# Patient Record
Sex: Female | Born: 1950 | Race: White | Hispanic: No | Marital: Married
Health system: Southern US, Community
[De-identification: ages and names within clinical notes are randomized; demographics above are authoritative.]

---

## 2004-11-14 ENCOUNTER — Encounter: Admission: RE | Admit: 2004-11-14 | Discharge: 2004-11-14 | Payer: Self-pay | Admitting: Neurosurgery

## 2005-01-08 ENCOUNTER — Ambulatory Visit: Payer: Self-pay | Admitting: Internal Medicine

## 2005-06-06 ENCOUNTER — Other Ambulatory Visit: Payer: Self-pay

## 2005-06-06 ENCOUNTER — Inpatient Hospital Stay: Payer: Self-pay | Admitting: Internal Medicine

## 2005-07-05 ENCOUNTER — Ambulatory Visit: Payer: Self-pay | Admitting: Internal Medicine

## 2005-07-15 ENCOUNTER — Emergency Department: Payer: Self-pay | Admitting: Emergency Medicine

## 2006-09-25 ENCOUNTER — Emergency Department: Payer: Self-pay | Admitting: Emergency Medicine

## 2007-02-03 ENCOUNTER — Emergency Department: Payer: Self-pay | Admitting: Emergency Medicine

## 2007-03-01 ENCOUNTER — Emergency Department: Payer: Self-pay | Admitting: Emergency Medicine

## 2008-11-15 ENCOUNTER — Emergency Department: Payer: Self-pay | Admitting: Emergency Medicine

## 2009-09-30 ENCOUNTER — Ambulatory Visit: Payer: Self-pay | Admitting: Internal Medicine

## 2010-03-11 ENCOUNTER — Emergency Department: Payer: Self-pay | Admitting: Emergency Medicine

## 2010-06-27 ENCOUNTER — Ambulatory Visit: Payer: Self-pay | Admitting: Family

## 2010-12-05 ENCOUNTER — Ambulatory Visit: Payer: Self-pay | Admitting: Internal Medicine

## 2010-12-29 ENCOUNTER — Ambulatory Visit: Payer: Self-pay | Admitting: Ophthalmology

## 2011-01-02 ENCOUNTER — Ambulatory Visit: Payer: Self-pay | Admitting: Ophthalmology

## 2012-10-24 ENCOUNTER — Inpatient Hospital Stay: Payer: Self-pay | Admitting: Nurse Practitioner

## 2012-10-24 LAB — CBC
HCT: 38.3 % (ref 35.0–47.0)
HGB: 13.3 g/dL (ref 12.0–16.0)
MCH: 31.6 pg (ref 26.0–34.0)
MCHC: 34.8 g/dL (ref 32.0–36.0)
MCV: 91 fL (ref 80–100)
Platelet: 191 10*3/uL (ref 150–440)
RBC: 4.22 10*6/uL (ref 3.80–5.20)
RDW: 14.3 % (ref 11.5–14.5)
WBC: 5.8 10*3/uL (ref 3.6–11.0)

## 2012-10-24 LAB — URINALYSIS, COMPLETE
Bacteria: NONE SEEN
Bilirubin,UR: NEGATIVE
Blood: NEGATIVE
Glucose,UR: NEGATIVE mg/dL (ref 0–75)
Ketone: NEGATIVE
Leukocyte Esterase: NEGATIVE
Nitrite: NEGATIVE
Ph: 6 (ref 4.5–8.0)
Protein: NEGATIVE
RBC,UR: 1 /HPF (ref 0–5)
Specific Gravity: 1.006 (ref 1.003–1.030)
Squamous Epithelial: NONE SEEN
WBC UR: 1 /HPF (ref 0–5)

## 2012-10-24 LAB — CK-MB: CK-MB: 3.9 ng/mL — ABNORMAL HIGH (ref 0.5–3.6)

## 2012-10-24 LAB — COMPREHENSIVE METABOLIC PANEL
Albumin: 3.6 g/dL (ref 3.4–5.0)
Alkaline Phosphatase: 76 U/L (ref 50–136)
Anion Gap: 7 (ref 7–16)
BUN: 13 mg/dL (ref 7–18)
Bilirubin,Total: 0.9 mg/dL (ref 0.2–1.0)
Calcium, Total: 9 mg/dL (ref 8.5–10.1)
Chloride: 108 mmol/L — ABNORMAL HIGH (ref 98–107)
Co2: 26 mmol/L (ref 21–32)
Creatinine: 0.55 mg/dL — ABNORMAL LOW (ref 0.60–1.30)
EGFR (African American): >60
EGFR (Non-African Amer.): >60
Glucose: 109 mg/dL — ABNORMAL HIGH (ref 65–99)
Osmolality: 282 (ref 275–301)
Potassium: 3.8 mmol/L (ref 3.5–5.1)
SGOT(AST): 17 U/L (ref 15–37)
SGPT (ALT): 17 U/L (ref 12–78)
Sodium: 141 mmol/L (ref 136–145)
Total Protein: 5.9 g/dL — ABNORMAL LOW (ref 6.4–8.2)

## 2012-10-24 LAB — PROTIME-INR
INR: 1
Prothrombin Time: 13.4 secs (ref 11.5–14.7)

## 2012-10-24 LAB — TROPONIN I: Troponin-I: <0.02 ng/mL

## 2012-10-26 LAB — BASIC METABOLIC PANEL
Anion Gap: 4 — ABNORMAL LOW (ref 7–16)
BUN: 4 mg/dL — ABNORMAL LOW (ref 7–18)
Calcium, Total: 8.6 mg/dL (ref 8.5–10.1)
Chloride: 106 mmol/L (ref 98–107)
Co2: 31 mmol/L (ref 21–32)
Creatinine: 0.47 mg/dL — ABNORMAL LOW (ref 0.60–1.30)
EGFR (African American): >60
EGFR (Non-African Amer.): >60
Glucose: 105 mg/dL — ABNORMAL HIGH (ref 65–99)
Osmolality: 279 (ref 275–301)
Potassium: 3.5 mmol/L (ref 3.5–5.1)
Sodium: 141 mmol/L (ref 136–145)

## 2012-10-26 LAB — HEMOGLOBIN: HGB: 9.8 g/dL — ABNORMAL LOW (ref 12.0–16.0)

## 2012-10-26 LAB — PLATELET COUNT: Platelet: 109 10*3/uL — ABNORMAL LOW (ref 150–440)

## 2012-10-27 LAB — HEMOGLOBIN: HGB: 9.4 g/dL — ABNORMAL LOW (ref 12.0–16.0)

## 2012-10-28 LAB — BASIC METABOLIC PANEL
Anion Gap: 4 — ABNORMAL LOW (ref 7–16)
BUN: 6 mg/dL — ABNORMAL LOW (ref 7–18)
Calcium, Total: 8.8 mg/dL (ref 8.5–10.1)
Chloride: 104 mmol/L (ref 98–107)
Co2: 30 mmol/L (ref 21–32)
Creatinine: 0.52 mg/dL — ABNORMAL LOW (ref 0.60–1.30)
EGFR (African American): >60
EGFR (Non-African Amer.): >60
Glucose: 126 mg/dL — ABNORMAL HIGH (ref 65–99)
Osmolality: 275 (ref 275–301)
Potassium: 3.6 mmol/L (ref 3.5–5.1)
Sodium: 138 mmol/L (ref 136–145)

## 2012-10-28 LAB — DIGOXIN LEVEL: Digoxin: 0.54 ng/mL

## 2012-10-28 LAB — TSH: Thyroid Stimulating Horm: 0.442 u[IU]/mL — ABNORMAL LOW

## 2013-01-01 ENCOUNTER — Ambulatory Visit: Payer: Self-pay | Admitting: Internal Medicine

## 2014-08-02 IMAGING — US US EXTREM LOW VENOUS*R*
1 series · 14 of 19 positions shown · non-contrast
Comparison: none

REASON FOR EXAM: CALL REPORT PARKER GLAZER [phone] AFTER 5 PM BEFORE 5PM
[phone] Right ...
COMMENTS:

[Series 1: us extrem low venous*right* · 0.09mm/px · 14 of 19 slices shown]
[im 1/19]
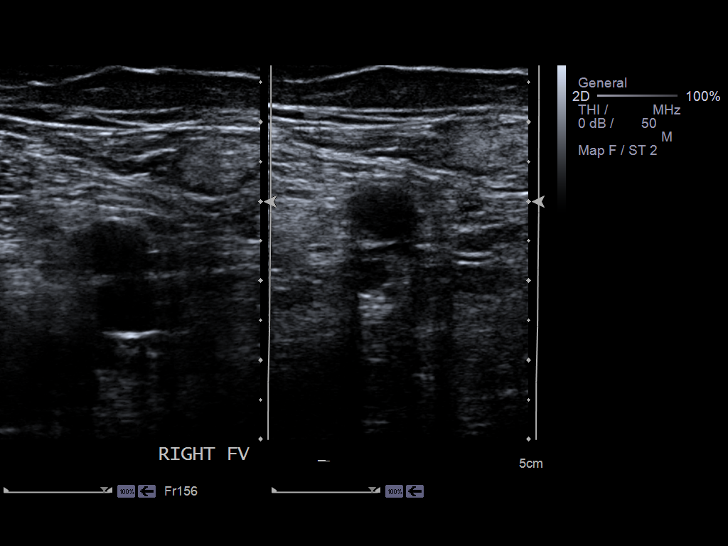
[im 3/19]
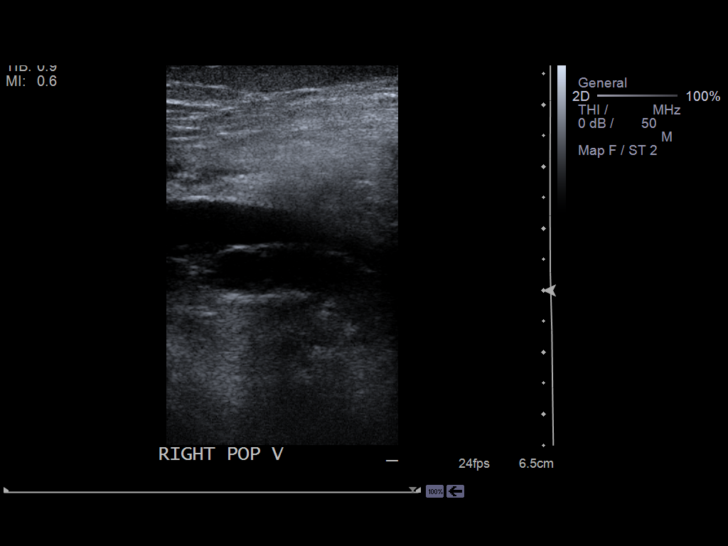
[im 4/19]
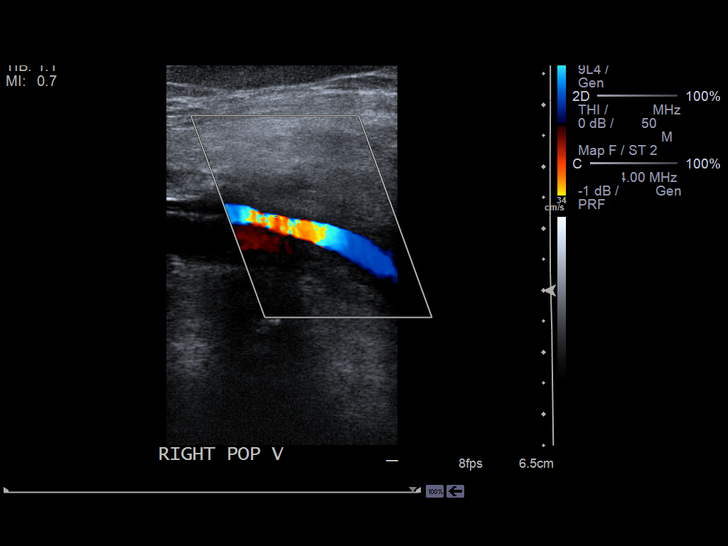
[im 5/19]
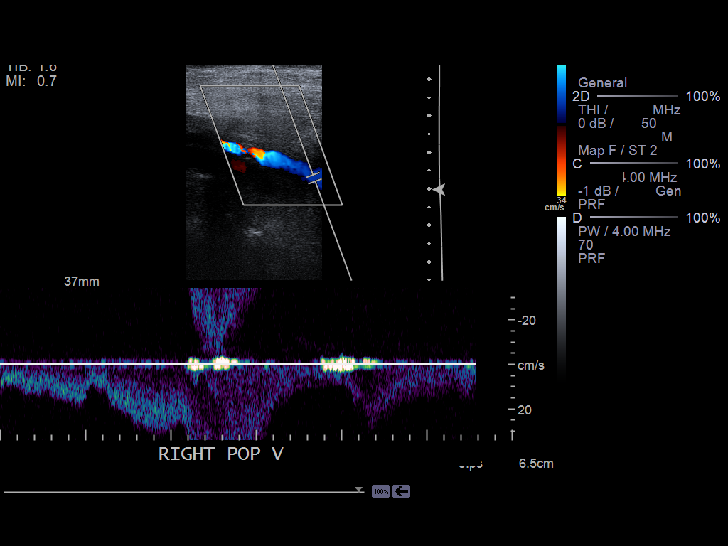
[im 7/19]
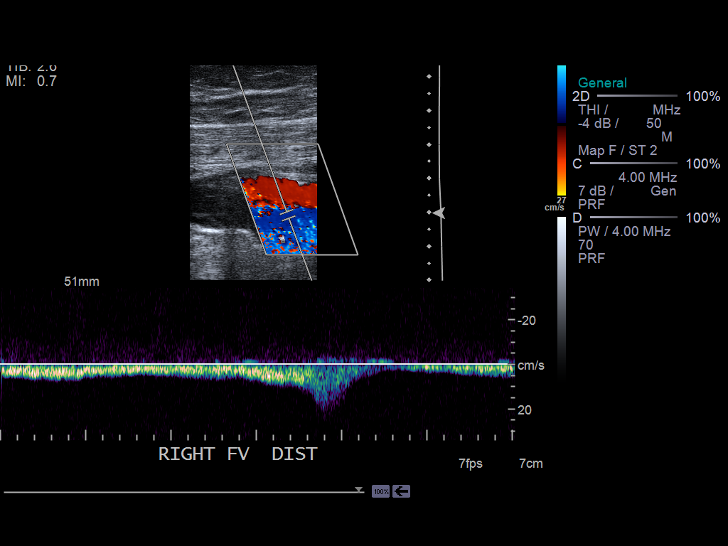
[im 8/19]
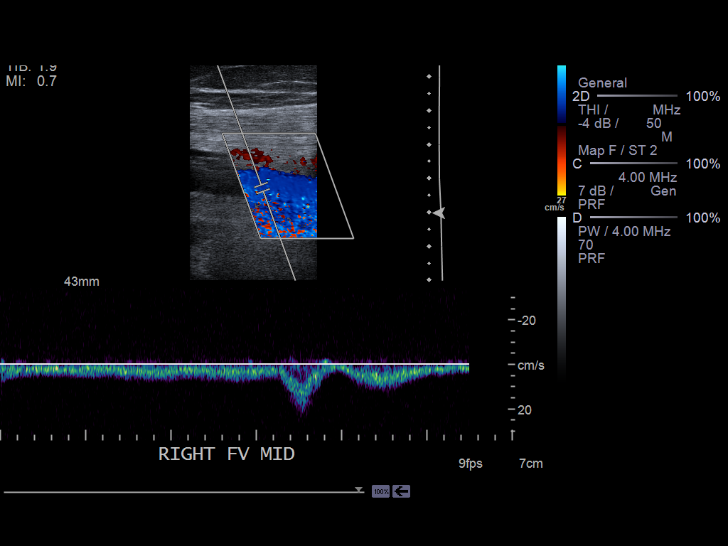
[im 9/19]
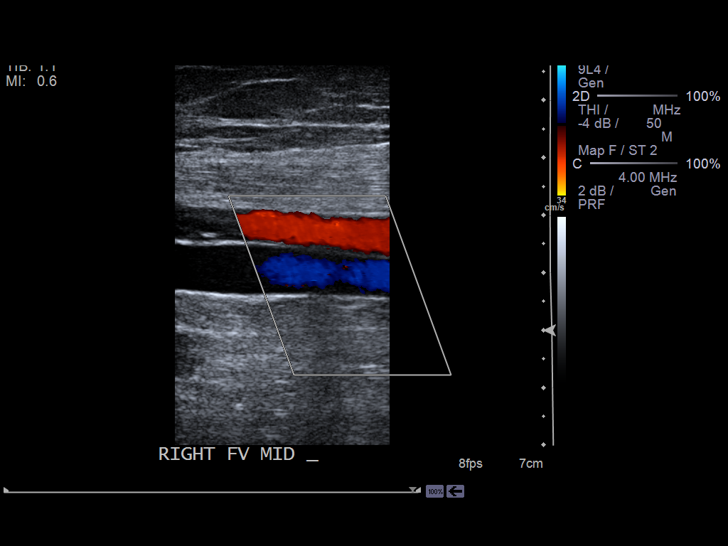
[im 11/19]
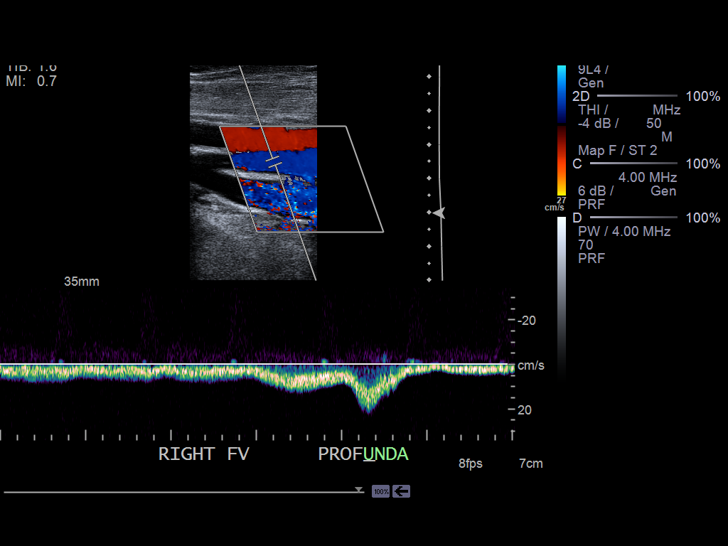
[im 12/19]
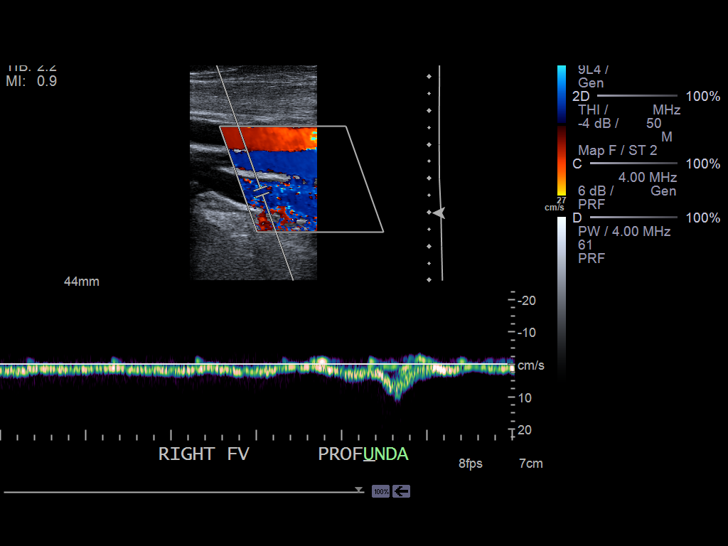
[im 13/19]
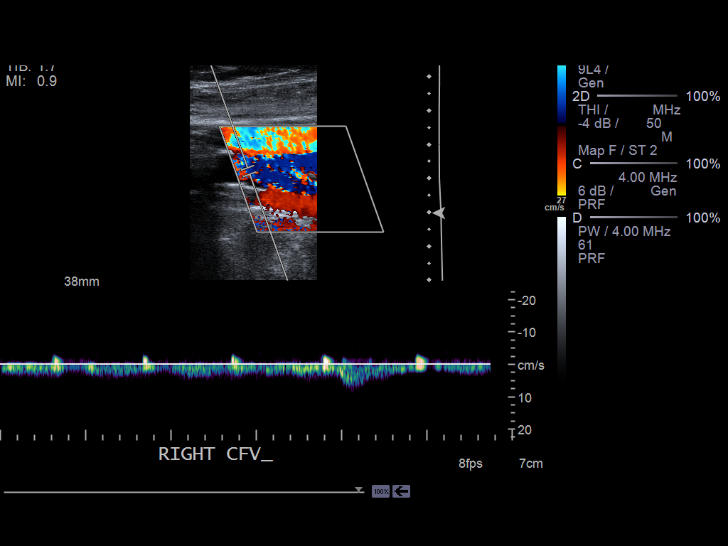
[im 15/19]
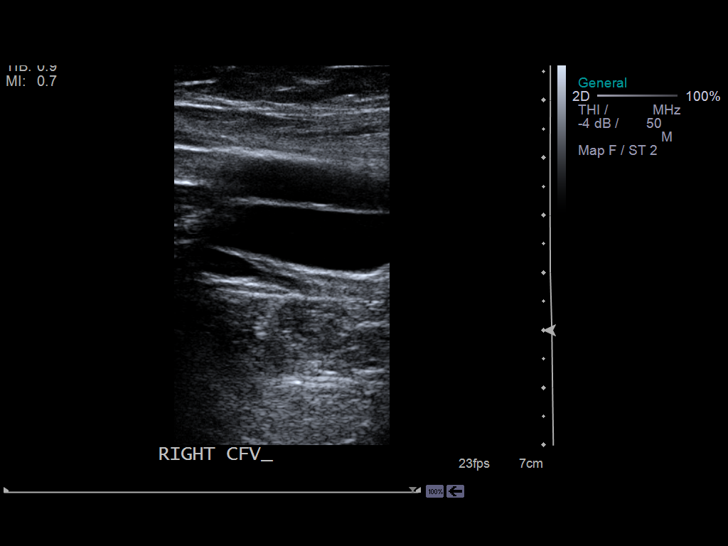
[im 16/19]
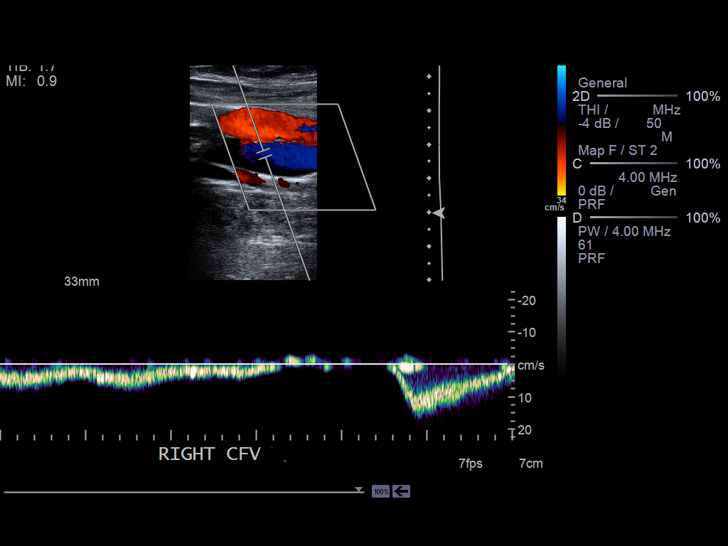
[im 17/19]
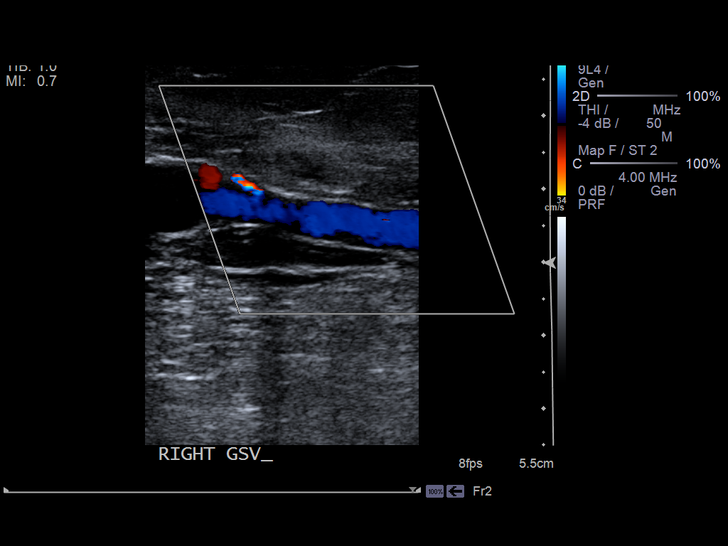
[im 19/19]
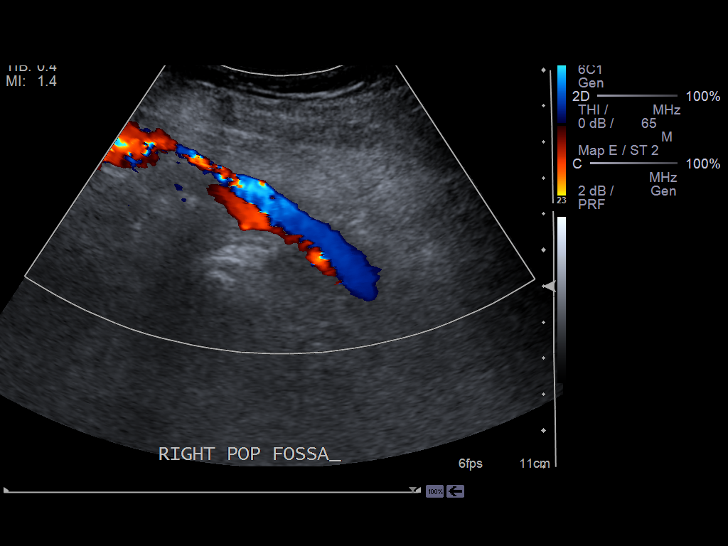

[14 of 19 positions shown; findings below may reference images not displayed]

PROCEDURE:     US  - US DOPPLER LOW EXTR RIGHT  - January 01, 2013  [DATE]

RESULT:     Comparison: None

Technique and findings: Multiple longitudinal and transverse grayscale as
well as color and spectral Doppler images of the right lower extremity veins
were obtained from the common femoral veins through the popliteal veins.

The right common femoral, femoral, and popliteal veins are patent,
demonstrating normal color-flow and compressibility. No intraluminal
thrombus is identified.  There is normal respiratory variation and
augmentation demonstrated at all vein levels.
IMPRESSION: No evidence of DVT in the right lower extremity.

## 2015-03-29 NOTE — Consult Note (Signed)
PATIENT NAME:  Marilyn [name], Marilyn [name] MR#:  [id number] DATE OF BIRTH:  [birth year]  DATE OF CONSULTATION:  10/24/2012  ADDENDUM   ALLERGIES: Patient has allergy to penicillin.   HOME MEDICATIONS:  1. Tylenol arthritis caplet 650 mg p.o. 2 tablets b.i.d. 2. Lopressor 1 tablet p.o. b.i.d.  3. Aspirin 81 mg p.o. daily.   ____________________________ Shaune PollackQing Bowie Delia, MD qc:cms D: 10/24/2012 18:39:22 ET T: 10/25/2012 07:43:32 ET JOB#: [id number]  cc: Shaune PollackQing Phill Steck, MD, <Dictator> Shaune PollackQING Kaisy Severino MD ELECTRONICALLY SIGNED 10/26/2012 4:21

## 2015-03-29 NOTE — H&P (Signed)
PATIENT NAME:  Marilyn [name], [name] MR#:  [id number] DATE OF BIRTH:  1951/09/22  DATE OF ADMISSION:  10/24/2012  PRIMARY CARE PHYSICIAN: Corky DownsJaved Masoud, MD REFERRING PHYSICIAN: Dr Rosita KeaMenz.    REASON FOR CONSULTATION: Preoperative evaluation.    REVIEW OF HISTORY: The patient is a 64 year old Caucasian female with a history of anxiety, hypertension, questionable atrial fibrillation presented in the ED with a fall by accident and was noted to have a right tibial fracture. Dr. Rosita KeaMenz requested preoperative evaluation. The patient is alert, awake, oriented.  She denies any symptoms except right leg pain. She was treated with morphine. Patient denies any syncope, loss of consciousness. No headache or dizziness or weakness.   PAST MEDICAL HISTORY: As mentioned above: hypertension, anxiety, questionable atrial fibrillation.    SOCIAL HISTORY: He smokes 10 to 15 cigarettes a day for many years. Denies any alcohol drinking or illicit drugs.   SURGICAL HISTORY: No.    FAMILY HISTORY: No hypertension, diabetes, heart attack, or stroke.   REVIEW OF SYSTEMS: CONSTITUTIONAL: The patient denies any fever or chills. No headache or dizziness. No weakness. EYES: No double vision, blurred vision. ENT: No postnasal drip, slurred speech, or dysphagia. CARDIOVASCULAR: No chest pain, palpitation, orthopnea, or nocturnal dyspnea. No leg edema. PULMONARY: No cough, sputum, shortness of breath, or hemoptysis. GASTROINTESTINAL: No abdominal pain, nausea, vomiting, or diarrhea. No melena or bloody stool. GU: No dysuria, hematuria, or incontinence. SKIN: No rash or jaundice. NEUROLOGY: No syncope, loss of consciousness or seizure.   PHYSICAL EXAMINATION:  VITAL SIGNS: Temperature 97.8, blood pressure 154/84, pulse 74, oxygen saturation 92% on 2 liters nasal cannula, respirations 22.   GENERAL: The patient is alert, awake, oriented, in no acute distress.   HEENT: Pupils round, equal, reactive to light and accommodation. Moist  oral mucosa. Clear oropharynx.   NECK: Supple. No JVD or carotid bruits. No lymphadenopathy. No thyromegaly.   CARDIOVASCULAR: S1, S2 regular rate and rhythm. No murmurs, gallops.   PULMONARY: Bilateral air entry. No wheezing or rales. No use of accessory muscles to breathe.   ABDOMEN: Soft. No distention or tenderness. No organomegaly. Bowel sounds present.   EXTREMITIES: No edema, clubbing, or cyanosis. No calf tenderness. Right lower extremity on fixation.   SKIN: No rash or jaundice.   NEUROLOGIC: Alert and oriented x3. No focal deficits. Patient cannot move right leg. Power 5 out of 5 other extremities. Sensation intact.   LABORATORY DATA: Urinalysis negative. CT of right knee showed mildly depressed lateral tibial plate fracture. There is a communicate displaced fracture of the fibular neck. CAT scan of head: No acute intracranial process. CBC normal. Glucose 109, BUN 13, creatinine 0.55, it actually is normal. Troponin less than 0.02. CK is 3.9. NR 1.0.  Chest x-ray: No acute disease of chest. EKG is pending.   IMPRESSION:  1. Right side tibial and fibular fracture.  2. Hypertension.  3. Tobacco abuse.   4. RECOMMENDATIONS: The patient has a low risk for right tibia-fibular fracture surgery. We will follow up EKG, continue Lopressor but hold aspirin.  5. Deep vein thrombosis (DVT) prophylaxis after surgery.  6. Smoking cessation was counseled for full 5 minutes.       7. I discussed the patient's situation and the recommendations with patient.   TIME SPENT: About 50 minutes.      ____________________________ Shaune PollackQing Symone Cornman, MD qc:vtd D: 10/24/2012 18:37:26 ET T: 10/25/2012 07:28:48 ET JOB#: [id number]  cc: Shaune PollackQing Barbarann Kelly, MD, <Dictator> Corky DownsJaved Masoud, MD Shaune PollackQING Challis Crill MD ELECTRONICALLY SIGNED 10/26/2012  4:21 

## 2015-03-29 NOTE — Op Note (Signed)
PATIENT NAME:  Marilyn [name], [name] MR#:  [id number] DATE OF BIRTH:  [birth year]  DATE OF PROCEDURE:  10/25/2012  PREOPERATIVE DIAGNOSIS: Right proximal tibial fracture and tibial shaft fracture.   POSTOPERATIVE DIAGNOSIS: Right proximal tibial fracture and tibial shaft fracture.   PROCEDURE: Open reduction internal fixation right proximal tibia and tibial shaft fractures.   ANESTHESIA: General.   SURGEON: Leitha SchullerMichael J. Hani Patnode, MD  DESCRIPTION OF PROCEDURE: Patient was brought to the Operating Room and after adequate general anesthesia was obtained, the right leg was prepped and draped in the usual sterile fashion. After appropriate patient identification and timeout procedures were completed the leg was approached with a proximal anterolateral incision. The incision was carried down through the skin and subcutaneous tissue. The proximal musculature was elevated after splitting the IT band and elevating its origin at [facility]er[facility]. The fracture site was identified and a small elevator used to elevate this and near anatomic alignment was obtained with gentle varus stress to the fracture site. The subcutaneous tissue was elevated for application of a plate. A Biomet proximal tibial locking plate was chosen based on the length of the fracture and a 15 hole plate chosen. This was slid down the lateral cortex and checking on both AP and lateral projections acceptable position of the plate was present. K wires were used to hold the plate for provisional fixation and proximal screws were placed with first conical bolts to pull the plate up against the lateral proximal tibia. Six additional locking screws were placed fixing the proximal fragment in appropriate position. Next distal incision was made to expose the distal five screw holes. These were sequentially filled using standard technique, drilling, measuring and placing the fully threaded cortical screws. Two additional kickstand proximal locking screws were  then inserted drilling, measuring and placing the screws after removing the fast guides. The alignment in both AP and lateral projections was deemed acceptable. The wounds were thoroughly irrigated and the wounds closed with #1 Vicryl deep, 2-0 Vicryl subcutaneously followed by skin staples and a wound VAC, Ace wrap, and knee immobilizer. The patient was sent to recovery room in stable condition where x-rays showed good position of the fracture with just slight flexion deformity at the shaft fragment.   ESTIMATED BLOOD LOSS: 200 mL.   COMPLICATIONS: None.   SPECIMEN: None.   IMPLANT: Biomet locking proximal tibial plate 15 hole, multiple screws.  ____________________________ Leitha SchullerMichael J. Khloi Rawl, MD mjm:cms D: 10/25/2012 16:58:32 ET T: 10/26/2012 12:31:58 ET JOB#: [id number]  cc: Leitha SchullerMichael J. Raha Tennison, MD, <Dictator>  Leitha SchullerMICHAEL J Dorri Ozturk MD ELECTRONICALLY SIGNED 10/26/2012 13:41

## 2015-03-29 NOTE — H&P (Signed)
PATIENT NAME:  Marilyn [name], Marilyn A MR#:  [id number] DATE OF BIRTH:  May 11, 1951  DATE OF ADMISSION:  10/24/2012  CHIEF COMPLAINT: Severe right leg pain.   HISTORY OF PRESENT ILLNESS: The patient is a 64 year old who was at work today. She works at [employer]. She just turned while standing at work and her leg went out from under her snapping. She came to the Emergency Room and was found to have a proximal tibial fracture, tibial plateau and shaft fracture and is being admitted for treatment of this. She denies any prodromal symptoms and has had some mild arthritic problems with the knee, but nothing significant, nothing requiring further prior treatment. .   ALLERGIES: Allergy to penicillin. She gets hives.   SOCIAL HISTORY: She smokes one-half to 1 pack per day.   PAST MEDICAL HISTORY:   1. Anxiety. 2. Hypertension. 3. Atrial fibrillation.  PAST SURGICAL HISTORY:  1. Total hysterectomy.  2. Cataract surgery.   MEDICATIONS: 1. Metoprolol 25 mg daily.  2. Nitrostat 0.4 p.r.n. chest pain. 3. She has been on Coumadin in the past, but is off of that for several years. There was for apparent atrial fibrillation.   REVIEW OF SYSTEMS: Positive just for the right leg pain although she did have an episode of getting lightheaded here and had head CT for evaluation of intracranial lesion.   PHYSICAL EXAMINATION:  GENERAL: Well-developed, well-nourished white female who appears her stated age in moderate distress secondary to right leg pain.   HEENT: Unremarkable.   LUNGS: Some wheezing is noted.   HEART: Regular rate. No murmur noted.   ABDOMEN: Soft, nontender.   EXTREMITIES: Remarkable for the right leg. There is swelling to the right lower leg. She does have trace dorsalis pedis and 1+ posterior tibial pulse. She is able to flex and extend the toes and sensation is intact. There is mild swelling to the leg and injury is not an open injury.   RADIOLOGICAL DATA: On x-ray review and CT  of the knee, there is lateral tibial plateau posterior depression with long longitudinal split down the proximal metaphysis. X-ray impression is comminuted right proximal tibial fracture with associated fibular neck fracture.   RECOMMENDATION: Open reduction and internal fixation. I discussed the risks, benefits, and possible complications, in particular infection and that she will need to stay nonweightbearing for about four weeks, then start partial weight bearing, but I think with nonoperative treatment, would likely be a very prolonged period of immobilization with very poor result. She understands this and will plan on surgery tomorrow if there are no medical contraindications. Will get preop medical evaluation.   ____________________________ Leitha SchullerMichael J. Jivan Symanski, MD mjm:ap D: 10/24/2012 16:31:26 ET T: 10/24/2012 17:21:32 ET JOB#: [id number]  cc: Leitha SchullerMichael J. Tashena Ibach, MD, <Dictator> Leitha SchullerMICHAEL J Taliya Mcclard MD ELECTRONICALLY SIGNED 10/25/2012 9:50

## 2015-03-29 NOTE — Consult Note (Signed)
PATIENT NAME:  Marilyn [name], Nahal A MR#:  [id number] DATE OF BIRTH:  1951/08/25  DATE OF CONSULTATION:  10/24/2012  PRIMARY CARE PHYSICIAN: Corky DownsJaved Masoud, MD REFERRING PHYSICIAN: Dr Rosita KeaMenz.    REASON FOR CONSULTATION: Preoperative evaluation.    REVIEW OF HISTORY: The patient is a 64 year old Caucasian female with a history of anxiety, hypertension, questionable atrial fibrillation presented in the ED with a fall by accident and was noted to have a right tibial fracture. Dr. Rosita KeaMenz requested preoperative evaluation. The patient is alert, awake, oriented.  She denies any symptoms except right leg pain. She was treated with morphine. Patient denies any syncope, loss of consciousness. No headache or dizziness or weakness.   PAST MEDICAL HISTORY: As mentioned above: hypertension, anxiety, questionable atrial fibrillation.    SOCIAL HISTORY: He smokes 10 to 15 cigarettes a day for many years. Denies any alcohol drinking or illicit drugs.   SURGICAL HISTORY: No.    FAMILY HISTORY: No hypertension, diabetes, heart attack, or stroke.   REVIEW OF SYSTEMS: CONSTITUTIONAL: The patient denies any fever or chills. No headache or dizziness. No weakness. EYES: No double vision, blurred vision. ENT: No postnasal drip, slurred speech, or dysphagia. CARDIOVASCULAR: No chest pain, palpitation, orthopnea, or nocturnal dyspnea. No leg edema. PULMONARY: No cough, sputum, shortness of breath, or hemoptysis. GASTROINTESTINAL: No abdominal pain, nausea, vomiting, or diarrhea. No melena or bloody stool. GU: No dysuria, hematuria, or incontinence. SKIN: No rash or jaundice. NEUROLOGY: No syncope, loss of consciousness or seizure.   PHYSICAL EXAMINATION:  VITAL SIGNS: Temperature 97.8, blood pressure 154/84, pulse 74, oxygen saturation 92% on 2 liters nasal cannula, respirations 22.   GENERAL: The patient is alert, awake, oriented, in no acute distress.   HEENT: Pupils round, equal, reactive to light and accommodation. Moist  oral mucosa. Clear oropharynx.   NECK: Supple. No JVD or carotid bruits. No lymphadenopathy. No thyromegaly.   CARDIOVASCULAR: S1, S2 regular rate and rhythm. No murmurs, gallops.   PULMONARY: Bilateral air entry. No wheezing or rales. No use of accessory muscles to breathe.   ABDOMEN: Soft. No distention or tenderness. No organomegaly. Bowel sounds present.   EXTREMITIES: No edema, clubbing, or cyanosis. No calf tenderness. Right lower extremity on fixation.   SKIN: No rash or jaundice.   NEUROLOGIC: Alert and oriented x3. No focal deficits. Patient cannot move right leg. Power 5 out of 5 other extremities. Sensation intact.   LABORATORY DATA: Urinalysis negative. CT of right knee showed mildly depressed lateral tibial plate fracture. There is a communicate displaced fracture of the fibular neck. CAT scan of head: No acute intracranial process. CBC normal. Glucose 109, BUN 13, creatinine 0.55, it actually is normal. Troponin less than 0.02. CK is 3.9. NR 1.0.  Chest x-ray: No acute disease of chest. EKG is pending.   IMPRESSION:  1. Right side tibial and fibular fracture.  2. Hypertension.  3. Tobacco abuse.   4. RECOMMENDATIONS: The patient has a low risk for right tibia-fibular fracture surgery. We will follow up EKG, continue Lopressor but hold aspirin.  5. Deep vein thrombosis (DVT) prophylaxis after surgery.  6. Smoking cessation was counseled for full 5 minutes.       7. I discussed the patient's situation and the recommendations with patient.   TIME SPENT: About 50 minutes.      ____________________________ Shaune PollackQing Deshannon Hinchliffe, MD qc:vtd D: 10/24/2012 18:37:26 ET T: 10/25/2012 07:28:48 ET JOB#: [id number]  cc: Shaune PollackQing Ilia Dimaano, MD, <Dictator> Corky DownsJaved Masoud, MD Shaune PollackQING Nareg Breighner MD ELECTRONICALLY SIGNED 10/26/2012  4:21 

## 2015-03-29 NOTE — H&P (Signed)
PATIENT NAME:  Marilyn ParkinsonSYKES, Marilyn [name] MR#:  [id number] DATE OF BIRTH:  February 15, 1951  DATE OF ADMISSION:  10/24/2012  ADDENDUM   ALLERGIES: Patient has allergy to penicillin.   HOME MEDICATIONS:  1. Tylenol arthritis caplet 650 mg p.o. 2 tablets b.i.d. 2. Lopressor 1 tablet p.o. b.i.d.  3. Aspirin 81 mg p.o. daily.   ____________________________ Shaune PollackQing Caleigha Zale, MD qc:cms D: 10/24/2012 18:39:22 ET T: 10/25/2012 07:43:32 ET JOB#: [id number]  cc: Shaune PollackQing Avryl Roehm, MD, <Dictator> Shaune PollackQING Amauris Debois MD ELECTRONICALLY SIGNED 10/26/2012 4:21

## 2015-03-29 NOTE — Discharge Summary (Signed)
PATIENT NAME:  Marilyn [name], Marilyn [name] MR#:  [id number] DATE OF BIRTH:  01-02-1951  DATE OF ADMISSION:  10/24/2012 DATE OF DISCHARGE:  10/28/2012  ADMITTING DIAGNOSIS: Lateral tibial plateau, posterior depression fracture, right, with fibular neck fracture.   DISCHARGE DIAGNOSIS: Lateral tibial plateau, posterior depression fracture, right, with fibular neck fracture.   OPERATION: On 10/25/2012, she had an ORIF of the right proximal tibial fracture.  SURGEON: Kennedy BuckerMichael Menz, MD  ANESTHESIA: General.   ESTIMATED BLOOD LOSS: 200 mL.  DRAINS: Wound vac applied.  COMPLICATIONS: None.  IMPLANTS: BioMet locking lateral tibial 15-hole plate.  The patient was stabilized and brought to the Recovery Room and then brought to the orthopedic floor where she was treated with physical therapy and for pain control.   HISTORY: Ms. Marilyn [name] is [name] 64 year old white female who was at work, at [employer], when she turned while standing and her left leg gave out from underneath her. [facility] where x-rays confirmed [name] right proximal tibial fracture, tibial plateau and shaft fractures.    PHYSICAL EXAMINATION:  Well-developed, well-nourished white female who appears her stated age in moderate distress secondary to right leg pain. HEENT: Unremarkable. LUNGS: Wheezing is noted. HEART: Regular rate, no murmur. ABDOMEN: Soft and nontender. EXTREMITIES: Swelling of the right lower leg, 1+ DP and PT pulses. She was able to flex and extend the toes, sensation is intact.   HOSPITAL COURSE: After being admitted on 10/24/2012, the patient underwent surgery the next day, on 10/25/2012.  She had good pain control afterwards and was brought to the orthopedic floor from the recovery room. [name] wound vac was applied. Her initial hemoglobin after surgery, on postoperative day one, 10/26/2012, was 9.8 and her platelets were 109.  On postoperative day two, 10/27/2012, she had [name] hemoglobin of 9.4.  She continued  to have moderate pain and [name] polar care unit was added.  On postoperative day three, 10/28/2012, she was making slow progress with physical therapy. Her serum digoxin level was 0.54 and TSH was 0.442.  CPM was continued to be used for range of motion while in the bed.  She did not tolerate therapy on 10/27/2012 because she had atrial fibrillation. She was placed on telemetry and beta blocker was increased. She is stable and is ready for discharge on 10/28/2012.   CONDITION AT DISCHARGE: Stable.  DISPOSITION: The patient will be sent to rehab.  DISCHARGE INSTRUCTIONS: The patient [facility] two weeks. She will do physical therapy and weight bear as tolerated, on the right lower extremity. She may be on [name] regular diet. TED hose will be used, knee high, bilaterally. The wound vac should be changed every 48 hours.  DISCHARGE MEDICATIONS: 1. Enoxaparin 3 mg/0.3 mL injectable solution one injection subcutaneous every 12 hours. 2. Oxycodone 5 mg one tablet p.o. every 4 hours. 3. Aspirin, enteric coated, 81 mg delayed release, one tablet p.o. once daily. 4. Tylenol Arthritis caplet 650 mg extended release two tablets p.o. twice daily. 5. Metoprolol 50 mg tablet one tablet p.o. twice [name] day.  6. Spiriva one inhalation daily. 7. Digoxin 0.125 mg p.o. daily.  ____________________________ Senta Kantor M. Haskel KhanBerndt, NP amb:slb D: 10/28/2012 10:53:17 ET     T: 10/28/2012 11:42:29 ET        JOB#: [id number] cc: Ajanay Farve M. Haskel KhanBerndt, NP, <Dictator> Burt EkAPRIL M Nadezhda Pollitt FNP ELECTRONICALLY SIGNED 11/04/2012 15:42

## 2016-03-10 DEATH — deceased
